# Patient Record
Sex: Male | Born: 1980 | Race: Black or African American | Hispanic: No | Marital: Single | Smoking: Current every day smoker
Health system: Southern US, Community
[De-identification: ages and names within clinical notes are randomized; demographics above are authoritative.]

---

## 2014-06-27 ENCOUNTER — Encounter (HOSPITAL_COMMUNITY): Payer: Self-pay | Admitting: Emergency Medicine

## 2014-06-27 ENCOUNTER — Emergency Department (HOSPITAL_COMMUNITY): Payer: Self-pay

## 2014-06-27 ENCOUNTER — Emergency Department (HOSPITAL_COMMUNITY)
Admission: EM | Admit: 2014-06-27 | Discharge: 2014-06-27 | Disposition: A | Discharge status: Home / Self Care | Payer: Self-pay | Attending: Emergency Medicine | Admitting: Emergency Medicine

## 2014-06-27 DIAGNOSIS — M791 Myalgia: Secondary | ICD-10-CM | POA: Insufficient documentation

## 2014-06-27 DIAGNOSIS — J159 Unspecified bacterial pneumonia: Secondary | ICD-10-CM | POA: Insufficient documentation

## 2014-06-27 DIAGNOSIS — R51 Headache: Secondary | ICD-10-CM | POA: Insufficient documentation

## 2014-06-27 DIAGNOSIS — Z720 Tobacco use: Secondary | ICD-10-CM | POA: Insufficient documentation

## 2014-06-27 DIAGNOSIS — J189 Pneumonia, unspecified organism: Secondary | ICD-10-CM

## 2014-06-27 DIAGNOSIS — R05 Cough: Secondary | ICD-10-CM

## 2014-06-27 DIAGNOSIS — R059 Cough, unspecified: Secondary | ICD-10-CM

## 2014-06-27 MED ORDER — AZITHROMYCIN 250 MG PO TABS: ORAL_TABLET | ORAL | Status: AC

## 2014-06-27 MED ORDER — AZITHROMYCIN 250 MG PO TABS
500.0000 mg | ORAL_TABLET | Freq: Once | ORAL | Status: AC
  Administered 2014-06-27: 500 mg via ORAL
  Filled 2014-06-27: qty 2

## 2014-06-27 MED ORDER — ACETAMINOPHEN 325 MG PO TABS
650.0000 mg | ORAL_TABLET | Freq: Once | ORAL | Status: AC
Start: 2014-06-27 — End: 2014-06-27
  Administered 2014-06-27: 650 mg via ORAL
  Filled 2014-06-27: qty 2

## 2014-06-27 NOTE — ED Provider Notes (Signed)
CSN: [id number]     Arrival date & time 06/27/14  0044 History   First MD Initiated Contact with Patient 06/27/14 0324     Chief Complaint  Patient presents with  . Generalized Body Aches  . Fever  . Fatigue     (Consider location/radiation/quality/duration/timing/severity/associated sxs/prior Treatment) HPI Complains of cough productive of yellow sputum, diffuse myalgias sore throat and headache gradual onset 1.5 weeks ago. Treated himself with Tylenol Cold and flu, last dose 4 days ago. He felt improved until tonight when he went back to work and symptoms worsen. Nothing makes symptoms better or worse. Other associated symptoms include 2 or 3 episodes of nonbloody diarrhea. No vomiting. He does admit to shortness of breath. No other associated symptoms History reviewed. No pertinent past medical history. past medical history negative  Past surgical history negative History reviewed. No pertinent past surgical history. No family history on file. History  Substance Use Topics  . Smoking status: Current Every Day Smoker  . Smokeless tobacco: Not on file  . Alcohol Use: Yes   2 drinks per week no illicit drug use  Review of Systems  Constitutional: Negative.   HENT: Positive for sore throat.   Respiratory: Positive for cough and shortness of breath.   Cardiovascular: Negative.   Gastrointestinal: Negative.   Musculoskeletal: Positive for myalgias.  Skin: Negative.   Neurological: Positive for headaches.  Psychiatric/Behavioral: Negative.   All other systems reviewed and are negative.     Allergies  Review of patient's allergies indicates no known allergies.  Home Medications   Prior to Admission medications   Not on File   BP 118/77 mmHg  Pulse 95  Temp(Src) 103.1 F (39.5 C) (Oral)  Resp 23  Wt 222 lb 2 oz (100.755 kg)  SpO2 95% Physical Exam  Constitutional: He appears well-developed and well-nourished. No distress.  HENT:  Head: Normocephalic and  atraumatic.  Mouth/Throat: No oropharyngeal exudate.  Oropharynx slightly reddened  Eyes: Conjunctivae are normal. Pupils are equal, round, and reactive to light.  Neck: Neck supple. No tracheal deviation present. No thyromegaly present.  Cardiovascular: Normal rate and regular rhythm.   No murmur heard. Pulmonary/Chest: Effort normal and breath sounds normal.  Abdominal: Soft. Bowel sounds are normal. He exhibits no distension. There is no tenderness.  Musculoskeletal: Normal range of motion. He exhibits no edema or tenderness.  Lymphadenopathy:    He has no cervical adenopathy.  Neurological: He is alert. Coordination normal.  Skin: Skin is warm and dry. No rash noted.  Psychiatric: He has a normal mood and affect.  Nursing note and vitals reviewed.   ED Course  Procedures (including critical care time) Labs Review Labs Reviewed - No data to display  Imaging Review No results found.   EKG Interpretation None     5:25 AM patient resting comfortably after treatment with Tylenol and azithromycin. MDM  Clinically patient has influenza-like illness however given infiltrate on x-ray we'll treat with antibiotics were community-acquired pneumonia given fever, cough and chest x-ray findings. I counseled patient for 5 minutes on smoking cessation Final diagnoses:  None   Plan prescription Zithromax Diagnosis #1 community acquired pneumonia #2 tobacco abuse     Doug SouSam Evadne Ose, MD 06/27/14 [id number]

## 2014-06-27 NOTE — Discharge Instructions (Signed)
Pneumonia Take Tylenol every 4 hours for temperature higher than 100.4 while awake. Call any of the numbers on the resource guide to get a primary care physician and to be seen if not better by next week. Ask your new physician to help you to stop smoking Pneumonia is an infection of the lungs.  CAUSES Pneumonia may be caused by bacteria or a virus. Usually, these infections are caused by breathing infectious particles into the lungs (respiratory tract). SIGNS AND SYMPTOMS   Cough.  Fever.  Chest pain.  Increased rate of breathing.  Wheezing.  Mucus production. DIAGNOSIS  If you have the common symptoms of pneumonia, your health care provider will typically confirm the diagnosis with a chest X-ray. The X-ray will show an abnormality in the lung (pulmonary infiltrate) if you have pneumonia. Other tests of your blood, urine, or sputum may be done to find the specific cause of your pneumonia. Your health care provider may also do tests (blood gases or pulse oximetry) to see how well your lungs are working. TREATMENT  Some forms of pneumonia may be spread to other people when you cough or sneeze. You may be asked to wear a mask before and during your exam. Pneumonia that is caused by bacteria is treated with antibiotic medicine. Pneumonia that is caused by the influenza virus may be treated with an antiviral medicine. Most other viral infections must run their course. These infections will not respond to antibiotics.  HOME CARE INSTRUCTIONS   Cough suppressants may be used if you are losing too much rest. However, coughing protects you by clearing your lungs. You should avoid using cough suppressants if you can.  Your health care provider may have prescribed medicine if he or she thinks your pneumonia is caused by bacteria or influenza. Finish your medicine even if you start to feel better.  Your health care provider may also prescribe an expectorant. This loosens the mucus to be coughed  up.  Take medicines only as directed by your health care provider.  Do not smoke. Smoking is a common cause of bronchitis and can contribute to pneumonia. If you are a smoker and continue to smoke, your cough may last several weeks after your pneumonia has cleared.  A cold steam vaporizer or humidifier in your room or home may help loosen mucus.  Coughing is often worse at night. Sleeping in a semi-upright position in a recliner or using a couple pillows under your head will help with this.  Get rest as you feel it is needed. Your body will usually let you know when you need to rest. PREVENTION A pneumococcal shot (vaccine) is available to prevent a common bacterial cause of pneumonia. This is usually suggested for:  People over 66 years old.  Patients on chemotherapy.  People with chronic lung problems, such as bronchitis or emphysema.  People with immune system problems. If you are over 65 or have a high risk condition, you may receive the pneumococcal vaccine if you have not received it before. In some countries, a routine influenza vaccine is also recommended. This vaccine can help prevent some cases of pneumonia.You may be offered the influenza vaccine as part of your care. If you smoke, it is time to quit. You may receive instructions on how to stop smoking. Your health care provider can provide medicines and counseling to help you quit. SEEK MEDICAL CARE IF: You have a fever. SEEK IMMEDIATE MEDICAL CARE IF:   Your illness becomes worse. This is especially  true if you are elderly or weakened from any other disease.  You cannot control your cough with suppressants and are losing sleep.  You begin coughing up blood.  You develop pain which is getting worse or is uncontrolled with medicines.  Any of the symptoms which initially brought you in for treatment are getting worse rather than better.  You develop shortness of breath or chest pain. MAKE SURE YOU:   Understand  these instructions.  Will watch your condition.  Will get help right away if you are not doing well or get worse. Document Released: 07/25/2005 Document Revised: 12/09/2013 Document Reviewed: 10/14/2010 [facility] Patient Information 2015 [employer], [place]. This information is not intended to replace advice given to you by your health care provider. Make sure you discuss any questions you have with your health care provider.  Emergency Department Resource Guide 1) Find a Doctor and Pay Out of Pocket Although you won't have to find out who is covered by your insurance plan, it is a good idea to ask around and get recommendations. You will then need to call the office and see if the doctor you have chosen will accept you as a new patient and what types of options they offer for patients who are self-pay. Some doctors offer discounts or will set up payment plans for their patients who do not have insurance, but you will need to ask so you aren't surprised when you get to your appointment.  2) Contact Your Local Health Department Not all health departments have doctors that can see patients for sick visits, but many do, so it is worth a call to see if yours does. If you don't know where your local health department is, you can check in your phone book. The CDC also has a tool to help you locate your state's health department, and many state websites also have listings of all of their local health departments.  3) Find a Walk-in Clinic If your illness is not likely to be very severe or complicated, you may want to try a walk in clinic. These are popping up all over the country in pharmacies, drugstores, and shopping centers. They're usually staffed by nurse practitioners or physician assistants that have been trained to treat common illnesses and complaints. They're usually fairly quick and inexpensive. However, if you have serious medical issues or chronic medical problems, these are probably not your best  option.  No Primary Care Doctor: - Call Health Connect at  [phone] - they can help you locate a primary care doctor that  accepts your insurance, provides certain services, etc. - Physician Referral Service- [phone]  Chronic Pain Problems: Organization         Address  Phone   Notes  [facility]  [phone] Patients need to be referred by their primary care doctor.   Medication Assistance: Organization         Address  Phone   Notes  [facility] Medication [facility] [address] [place], [place] [address] [phone] --Must be a resident of [facility] [place] -- Must have NO insurance coverage whatsoever (no Medicaid/ Medicare, etc.) -- The pt. MUST have a primary care doctor that directs their care regularly and follows them in the community   MedAssist  [phone]   [employer]  [phone]) [phone]    Agencies that provide inexpensive medical care: Organization         Address  Phone   Notes  [facility]  [phone]   [facility]    [phone]   [facility] [place], [place] [address] [phone]   [place] [address] [place] [phone]   Planned Parenthood    [phone]   [facility]    [phone]   [place] [address], [place] Phone:  [phone], Fax:  [phone] Hours of Operation:  9 am - 6 pm, M-F.  Also accepts Medicaid/Medicare and self-pay.  [facility] for [place], [address], [place] Phone: [phone], Fax: [phone]. Hours of Operation:  8:30 am - 5:30 pm, M-F.  Also accepts Medicaid and self-pay.  [facility] [place] [address], [place] Phone: [phone]   [place], [place], [place] [phone], Ext. [phone] Mondays & Thursdays: 7-9 AM.  First 15  patients are seen on a first come, first serve basis.    [place] Providers:  Organization         Address  Phone   Notes  [facility] [address], [place] [phone] Also accepts self-pay patients.  [facility] [address], [place]  [phone]   [place], [address], [place] [phone]   [facility] [address], [place] [phone]   Lucianne Lei [address], [place]   [phone] Only accepts [place] Access [place] patients after they have their name applied to their card.   Self-Pay (no insurance) in [facility]:  Organization         Address  Phone   Notes  Sickle Cell Patients, [facility] [place] [phone]   [facility] [place] [phone]   [facility] [place]  [place], [place], [place] [phone]   [facility]/Dr. Osei-Bonsu  [address] [place] or [address], [place] [phone] Phone number for both [place] and [place] locations is the same.  [facility] [address] [place] [phone]   [facility] [address] [place] or [address] [phone]   [facility] [address] [place] [phone], phone; [phone], fax Sees patients 1st and 3rd Saturday of every month.  Must not qualify for public or private insurance (i.e. Medicaid, Medicare, [place] Health Choice, Veterans' Benefits)  Household income should be no more than 200% of the poverty level The clinic cannot treat you if you are pregnant or think you are pregnant  Sexually transmitted diseases are not treated at the clinic.    Dental  Care: Organization         Address  Phone  Notes  [facility] Department of [facility] [place] [phone] Accepts children up to age 19 who are enrolled in [place] or [place]; pregnant women with a Medicaid card; and children who have applied for Medicaid or  Health Choice, but were declined, whose parents can pay a reduced fee at time of service.  [facility] of  [facility]  [address], [place] [phone] Accepts children up to age 3 who are enrolled in Medicaid or [place]; pregnant women with a Medicaid card; and children who have applied for Medicaid or [place] Health Choice, but were declined, whose parents can pay a reduced fee at time of service.  [facility] Access PRO[facility]  [place] [phone] Patients are seen by appointment only. Walk-ins are not accepted. [place] will see patients 80 years of age and older. Monday - Tuesday (8am-5pm) Most Wednesdays (8:30-5pm) $30 per visit, cash only  [facility] Adult Dental Access PROGRAM  [address], [facility] [phone] Patients are seen by appointment only. Walk-ins are not accepted. [place] will see patients 28 years of age and older. One Wednesday Evening (Monthly: Volunteer Based).  $30 per visit, cash only  [place]  [phone] for adults; Children under age 55, call Graduate Pediatric Dentistry at [phone]. Children aged 58-14, please call [phone] to request a pediatric application.  Dental services are provided in all areas of dental care including fillings, crowns and bridges, complete and partial dentures, implants, gum treatment, root canals, and extractions. Preventive care is also provided. Treatment is provided to both adults and children. Patients are selected via a lottery and there is often a waiting list.   [facility] [address] [place]  [phone] [email or web address]   [facility] [address] [place], [place] [phone], Ext. [phone] Second and Fourth Thursday of each month, opens at 6:30 AM; Clinic ends at 9 AM.  Patients are seen on a first-come first-served basis, and a limited number are seen during each clinic.   [facility]  [address] [place], [place] [phone]   Eligibility Requirements You must have lived in [place], [place], or [place] counties for at least the last three months.   You cannot be eligible for state or federal sponsored [employer], including [employer], [place], or [employer].   You generally cannot be eligible for healthcare insurance through your employer.    How to apply: Eligibility screenings are held every Tuesday and Wednesday afternoon from 1:00 pm until 4:00 pm. You do not need an appointment for the interview!  [facility] [address], [place], [place]   Between  [place] Department  [place]  [phone]    Behavioral Health Resources in the Community: Intensive Outpatient Programs Organization         Address  Phone  Notes  [place]. [address] [place], [place] [phone]   [facility] [address] [place], [place]   ADS: Alcohol & Drug Svcs [address] [place], [place]   [place] [address],  [place], [place] or [phone]   Substance Abuse Resources Organization         Address  Phone  Notes  Alcohol and Drug Services  [phone]   [place]  [phone]   [facility]   Chinita Pester  [phone]   Residential & Outpatient Substance Abuse Program  [phone]    Psychological Services Organization         Address  Phone  Notes  [place]  [phone]- [phone]   [facility]  336- [phone]   [facility] [address], [place] [phone] or [phone]    Mobile Crisis Teams Organization         Address  Phone  Notes  [facility], Mobile Crisis Care Unit  [phone]   [facility]  [address] [place], [place] [phone]   Doristine LocksSharon DeEsch [address] [place] [phone]    Self-Help/Support Groups Organization         Address  Phone             Notes  [facility] of [place] - variety of support groups  336- [phone] Call for more information  Narcotics Anonymous (NA), [facility] [address], [place]  2 meetings at this location   Statisticianesidential Treatment Programs Organization         Address  Phone  Notes  [facility] [address],    [place]  [phone]   [facility]  [address], [place] [phone], [place], [place] [phone]   [facility] [address], [place] [phone] Admissions: 8am-3pm M-F  [facility] [address]    [place], [place] [phone]   [facility] [address], [place], [place] [phone]   [facility] [address]  [place], [place] [phone]   [facility] - Intensive Outpatient [address], [place], [place] [phone]   [facility] ([facility] [address]  [place], [place] [phone] or [phone]   [facility] (RTS) [address] [place], [place] [phone] Accepts Medicaid  [facility] [address]  [place] [phone] Substance Abuse/Addiction Treatment   [facility] Behavioral Health Resources Organization         Address  Phone  Notes  [facility]  [phone]) [phone]   Angie FavaJulie Brannon, PhD [address], [address] [place], [place]   [phone]) [phone] or [phone]   [facility]   [address] [place], [place] [phone](336) [phone]   [facility] [address], [place], [place] [phone] Insurance/Medicaid/sponsorship through [facility]  [facility] [address]                                    [place], [place] [phone]) [phone] Therapy/tele-psych/case  [facility] [address].   [place], [place] [phone]) [phone]    Dr. Lolly MustacheArfeen  [phone](336) [phone]   [facility]  [employer] [facility] 1) [address], [place] 2) [address], [place] 3)  [address], [place] [phone]) [phone]  [phone](336) [phone]   [facility] Child Abuse Hotline [phone](336) 9022358268 or [phone](336) [phone] (After Hours)

## 2014-06-27 NOTE — ED Notes (Signed)
Pt. reports flu-like symptoms : fever , body aches , chills , fatigue , productive cough onset last week . Respirations unlabored / alert and oriented.

## 2014-06-27 NOTE — ED Notes (Signed)
Pt a/o x 4 with steady gait on d/c. 

## 2016-03-08 IMAGING — CR DG CHEST 2V
2 series · 2 of 2 positions shown · non-contrast
Comparison: None.

CLINICAL DATA: Type cough, fever, chest pain, smoker.

EXAM:
CHEST  2 VIEW

[w chest pa]
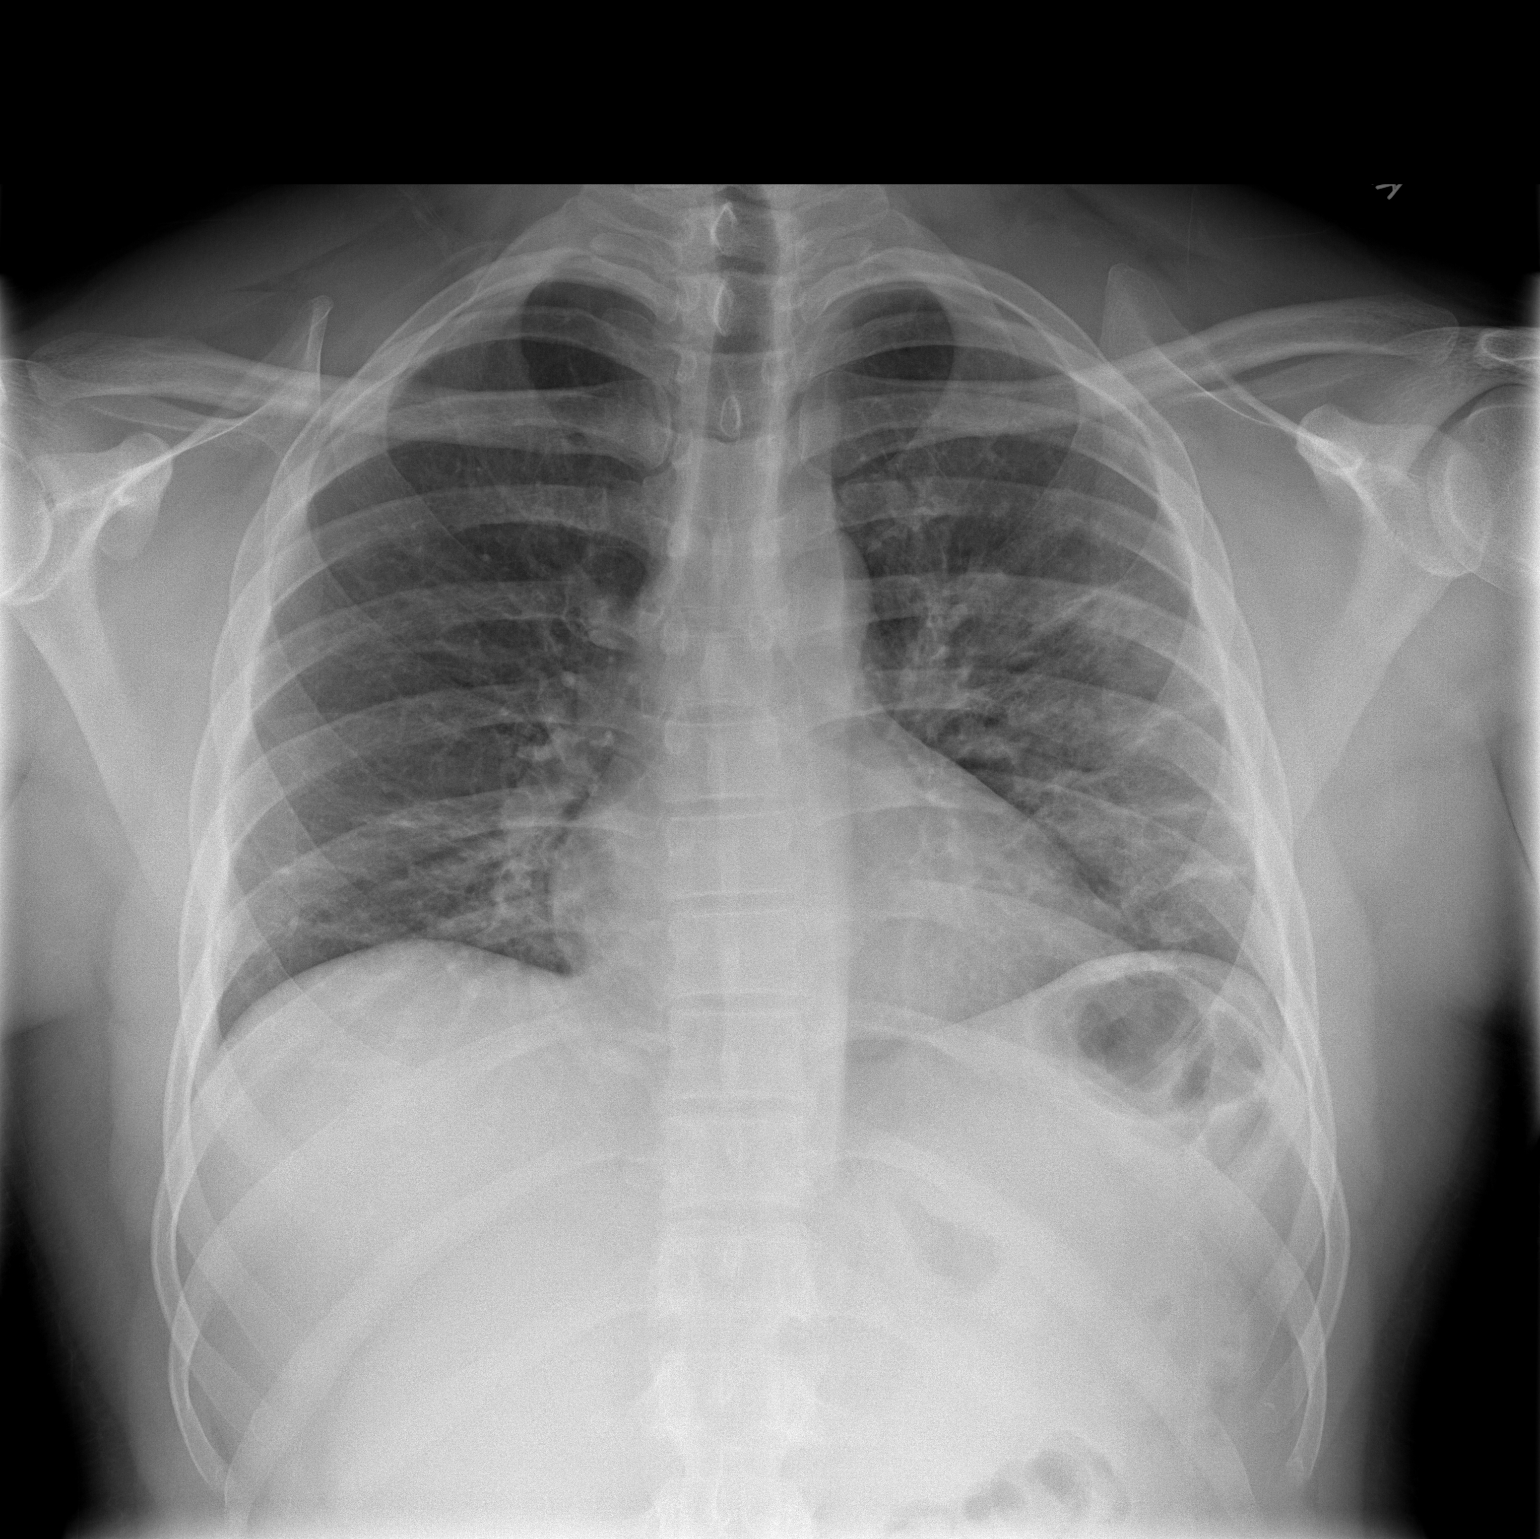

[w chest lat]
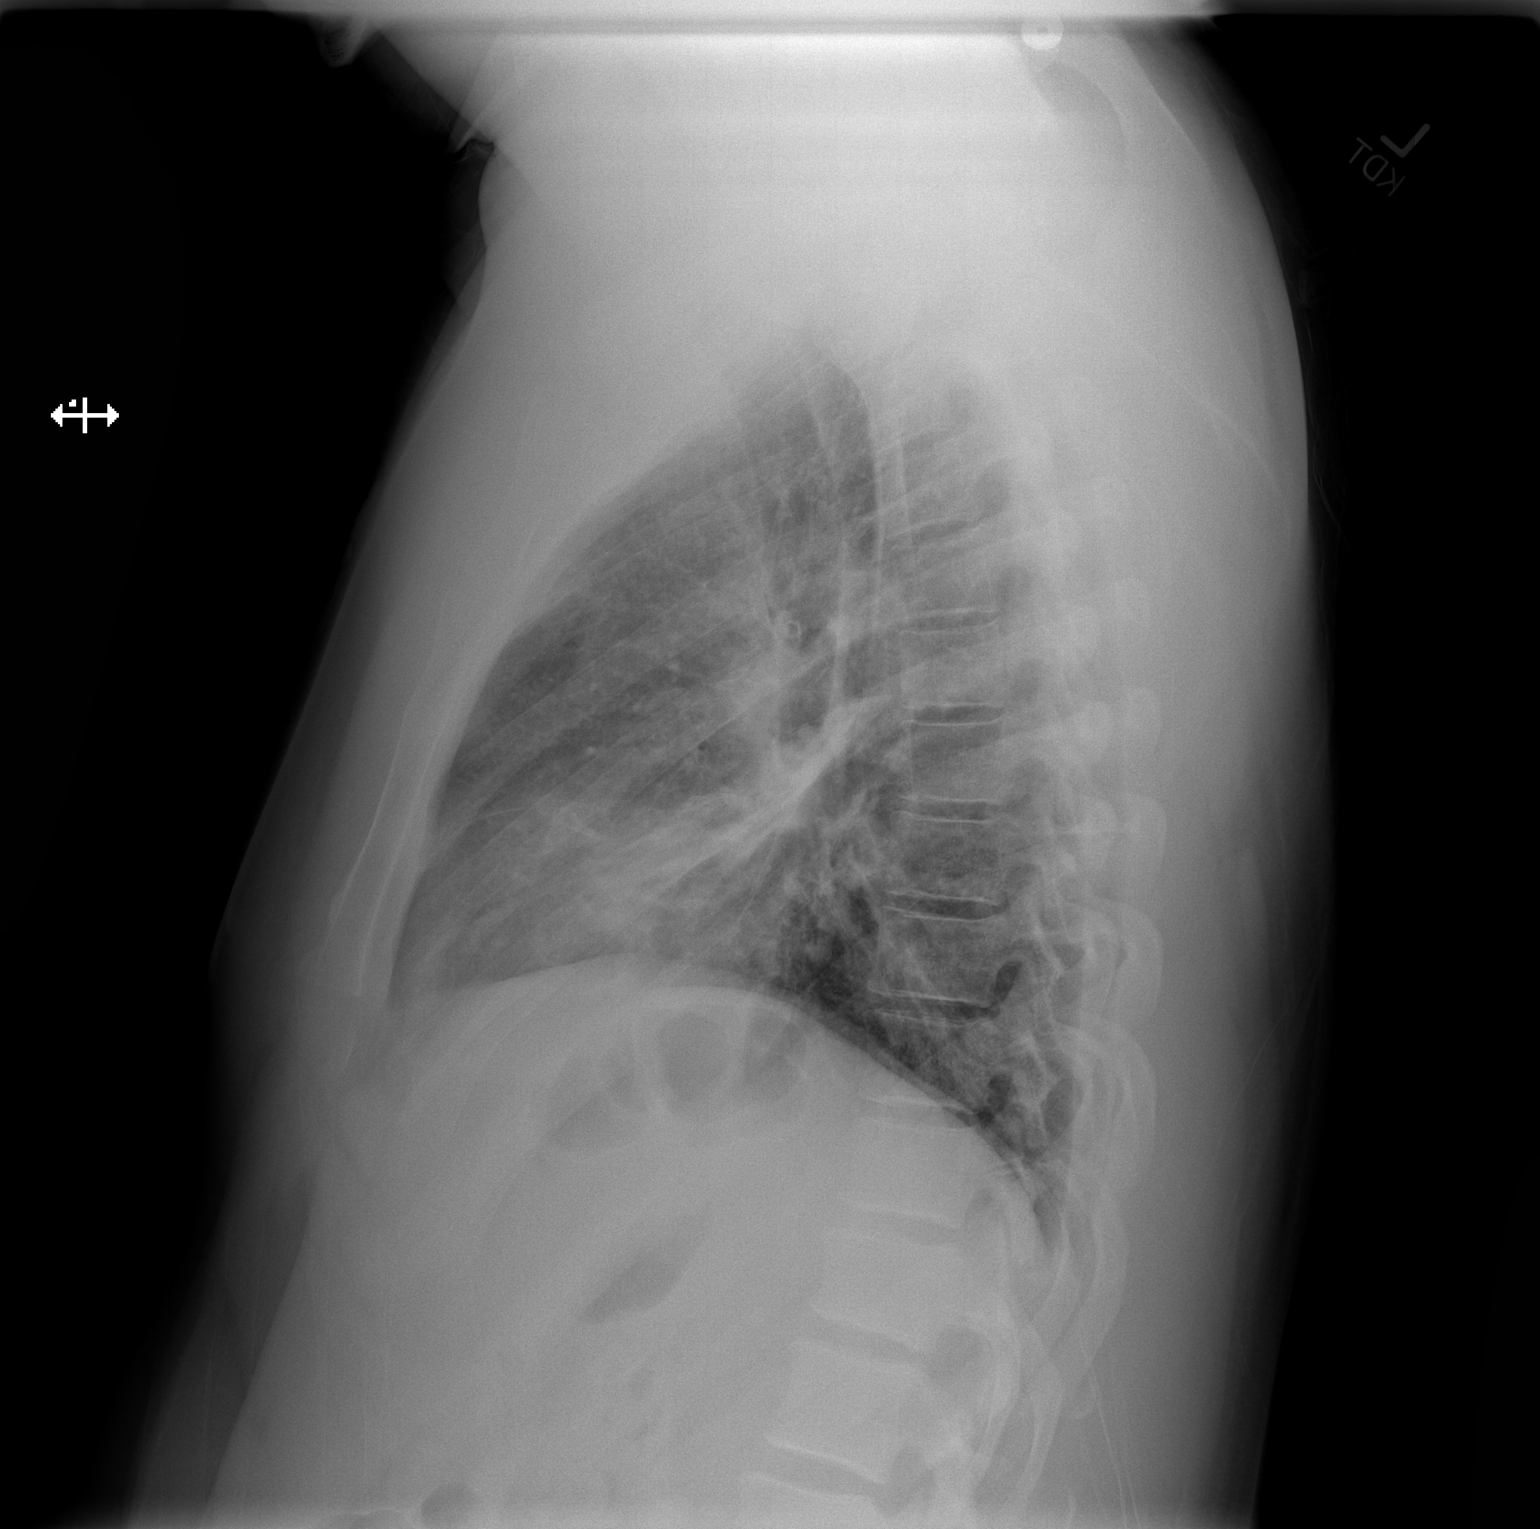

[2 of 2 positions shown; findings below may reference images not displayed]

FINDINGS: Shallow inspiration. The linear infiltration in the right lung base
and left mid and lower lung zones. Changes suggest early pneumonia.
Heart size and pulmonary vascularity are normal. No pneumothorax. No
blunting of costophrenic angles.
IMPRESSION: Linear infiltration in the right lung base and left mid and lower
lung zones suggesting early pneumonia.
